# Patient Record
Sex: Female | Born: 1965 | Race: White | Hispanic: No | Marital: Married | Smoking: Current some day smoker
Health system: Southern US, Community
[De-identification: ages and names within clinical notes are randomized; demographics above are authoritative.]

## PROBLEM LIST (undated history)

## (undated) DIAGNOSIS — G8929 Other chronic pain: Secondary | ICD-10-CM

## (undated) DIAGNOSIS — M549 Dorsalgia, unspecified: Secondary | ICD-10-CM

## (undated) DIAGNOSIS — E119 Type 2 diabetes mellitus without complications: Secondary | ICD-10-CM

## (undated) DIAGNOSIS — M797 Fibromyalgia: Secondary | ICD-10-CM

---

## 2007-07-09 ENCOUNTER — Ambulatory Visit (HOSPITAL_COMMUNITY): Admission: RE | Admit: 2007-07-09 | Discharge: 2007-07-09 | Payer: Self-pay | Admitting: Neurosurgery

## 2007-07-16 ENCOUNTER — Emergency Department (HOSPITAL_COMMUNITY): Admission: EM | Admit: 2007-07-16 | Discharge: 2007-07-16 | Payer: Self-pay | Admitting: Emergency Medicine

## 2007-08-06 ENCOUNTER — Ambulatory Visit (HOSPITAL_COMMUNITY): Admission: RE | Admit: 2007-08-06 | Discharge: 2007-08-07 | Payer: Self-pay | Admitting: Neurosurgery

## 2008-02-25 ENCOUNTER — Ambulatory Visit (HOSPITAL_COMMUNITY): Admission: RE | Admit: 2008-02-25 | Discharge: 2008-02-25 | Payer: Self-pay | Admitting: Orthopedic Surgery

## 2009-04-03 IMAGING — CR DG CHEST 2V
2 series · 2 of 2 positions shown · non-contrast
Comparison: None.

CLINICAL DATA: PREADMIT  ;OR 07/09/07;

CHEST - 2 VIEW

[view not recorded (1 of 2)]
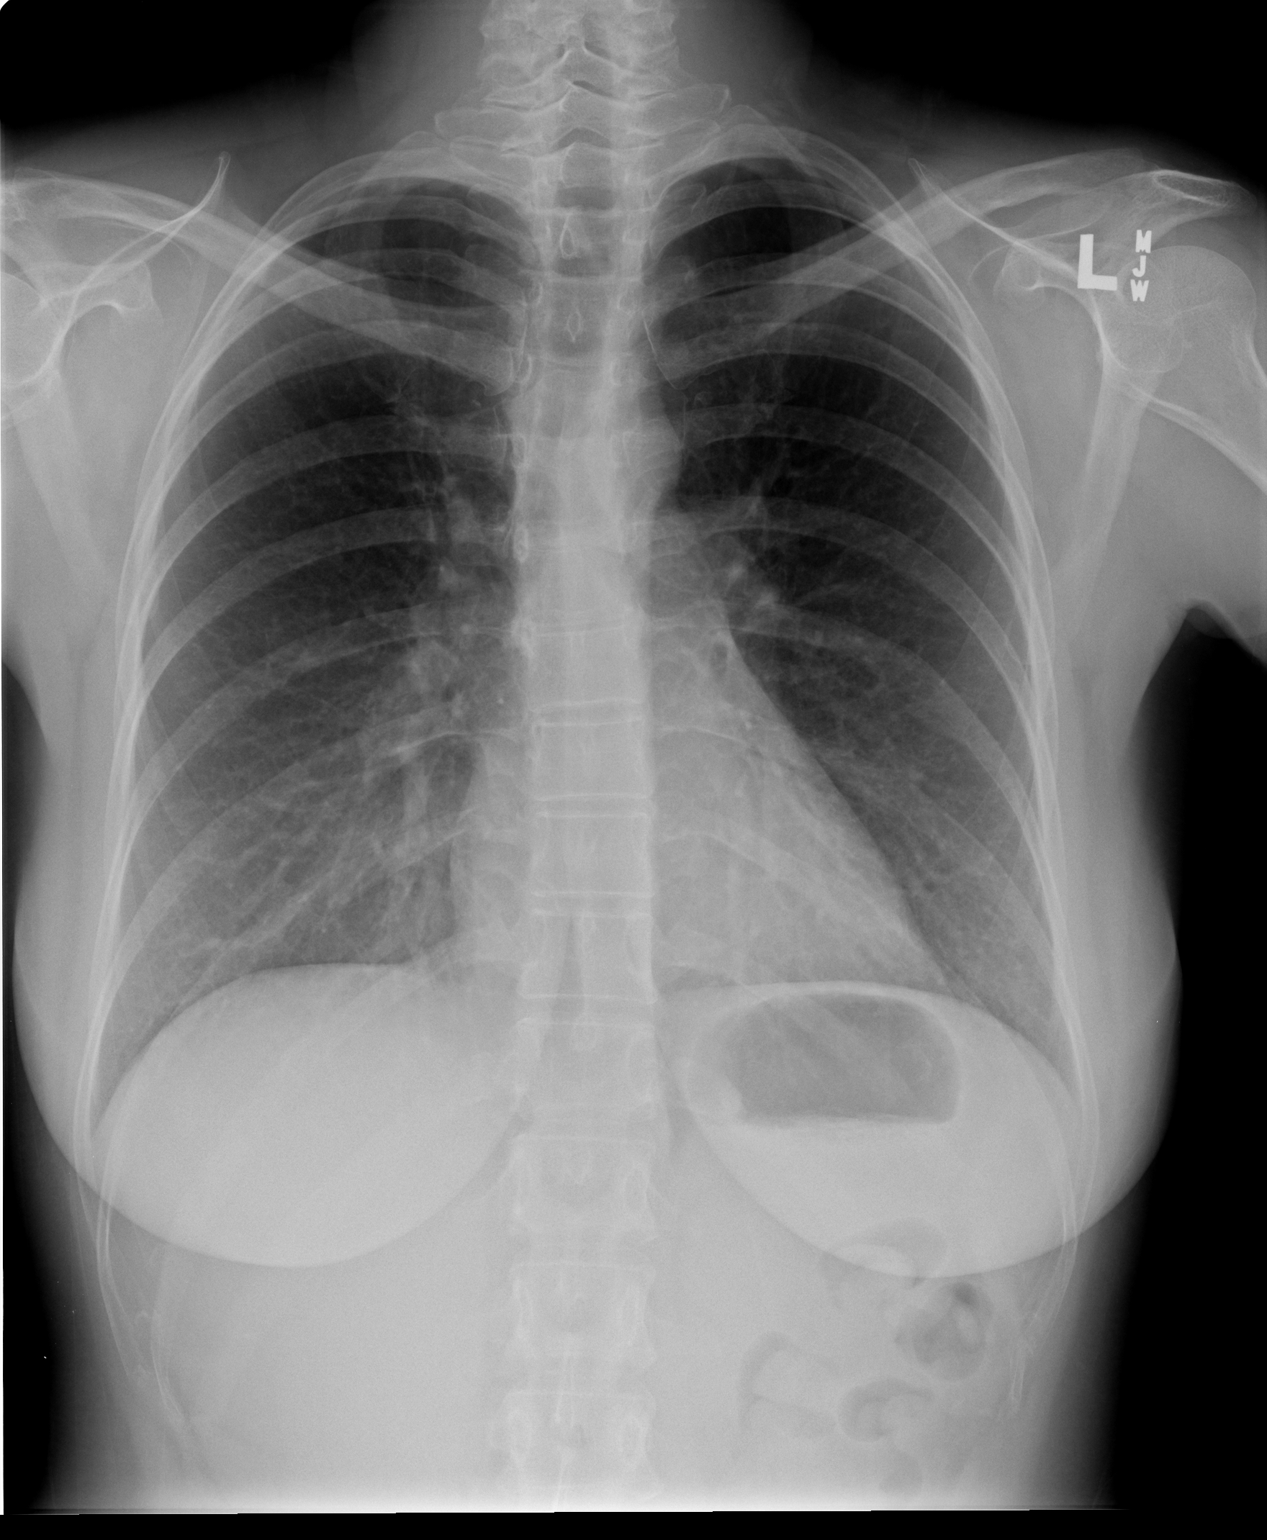

[view not recorded (2 of 2)]
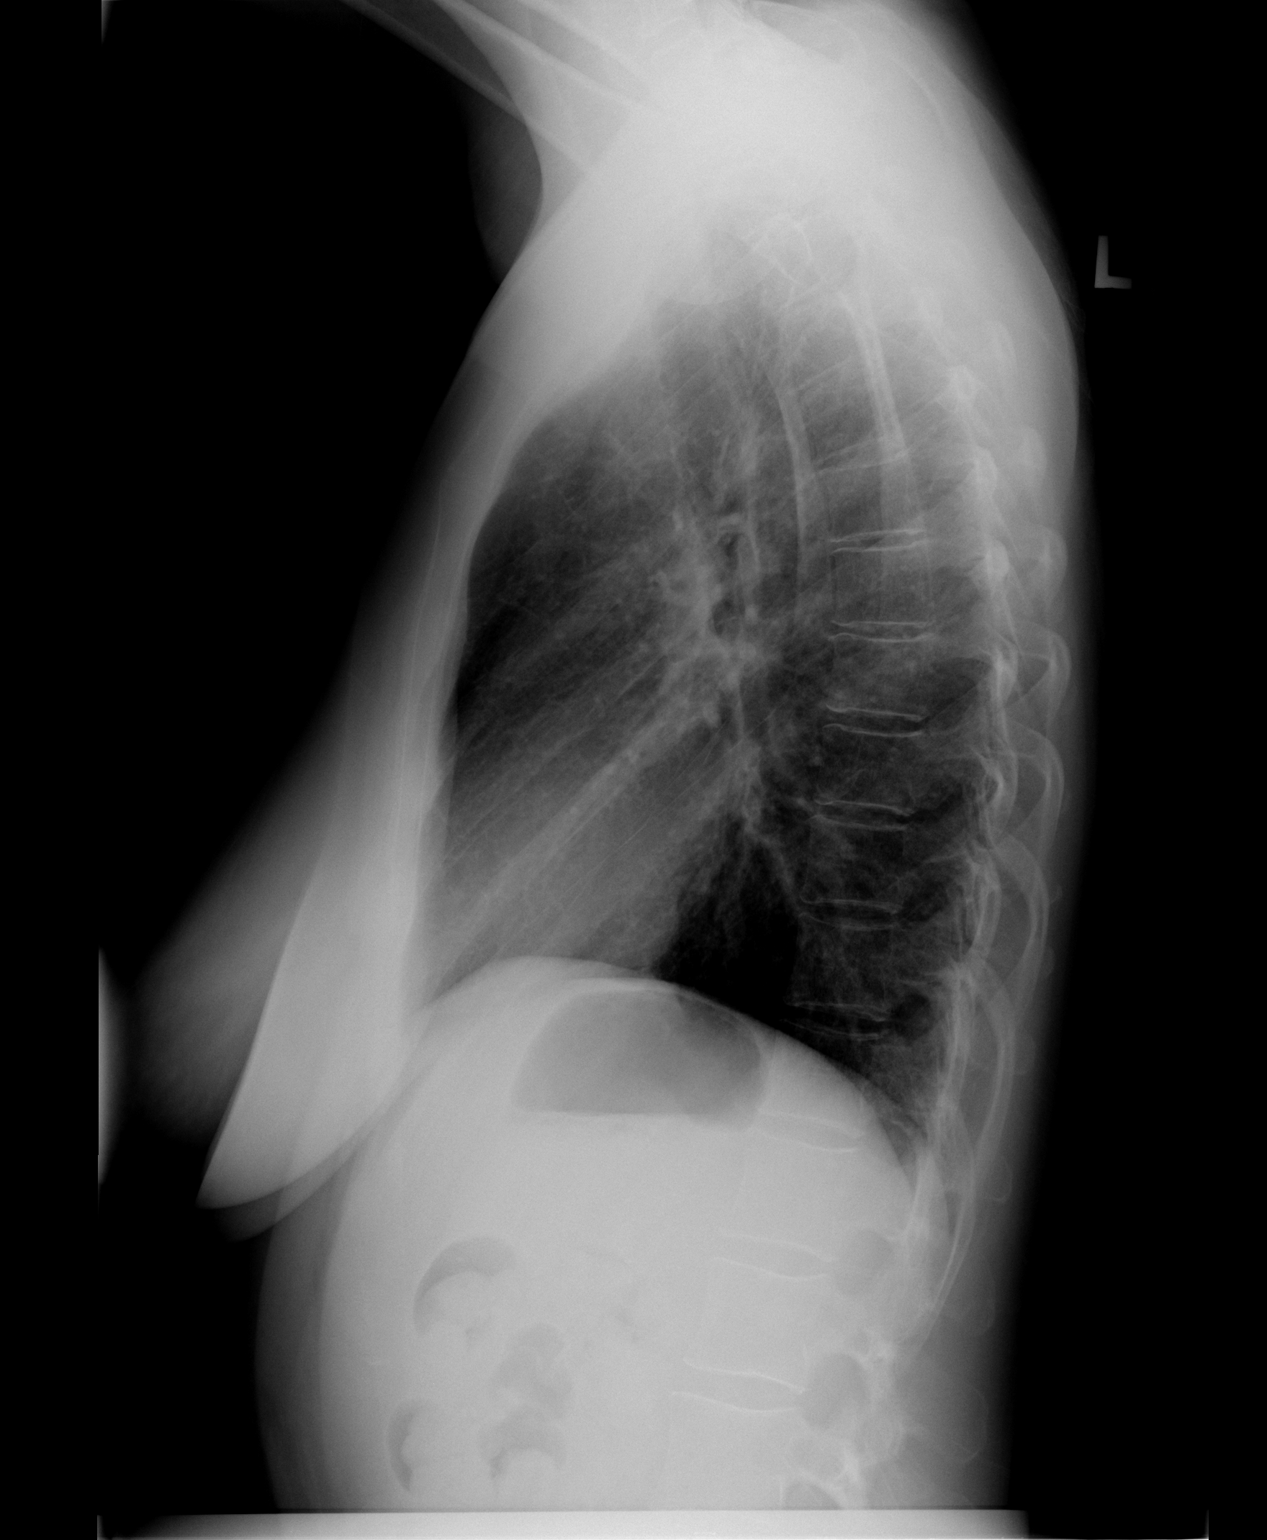

[2 of 2 positions shown; findings below may reference images not displayed]

FINDINGS: There is mild peribronchial thickening but no focal
airspace disease.  Heart size is normal.  No effusion.  No focal
bony abnormality.
IMPRESSION: Findings compatible with chronic bronchitic change.  No acute
finding.

## 2009-04-08 IMAGING — CR DG LUMBAR SPINE 2-3V
1 series · 1 of 1 positions shown · non-contrast
Comparison: none

CLINICAL DATA: L4-5 laminectomy, microdiskectomy, HNP.  
 LUMBAR SPINE ? 2 VIEW:
 View #1 was taken at 9347 hours and reveals needle pointer posteriorly projecting between the L4 and L5 spinous process and aimed at the superior aspect of L5.  
 View #2 was taken at 0033 hours revealing retractors in place and surgical instrument pointer aimed at the superior aspect of L5, basically at the L4-5 interspace.

[view not recorded]
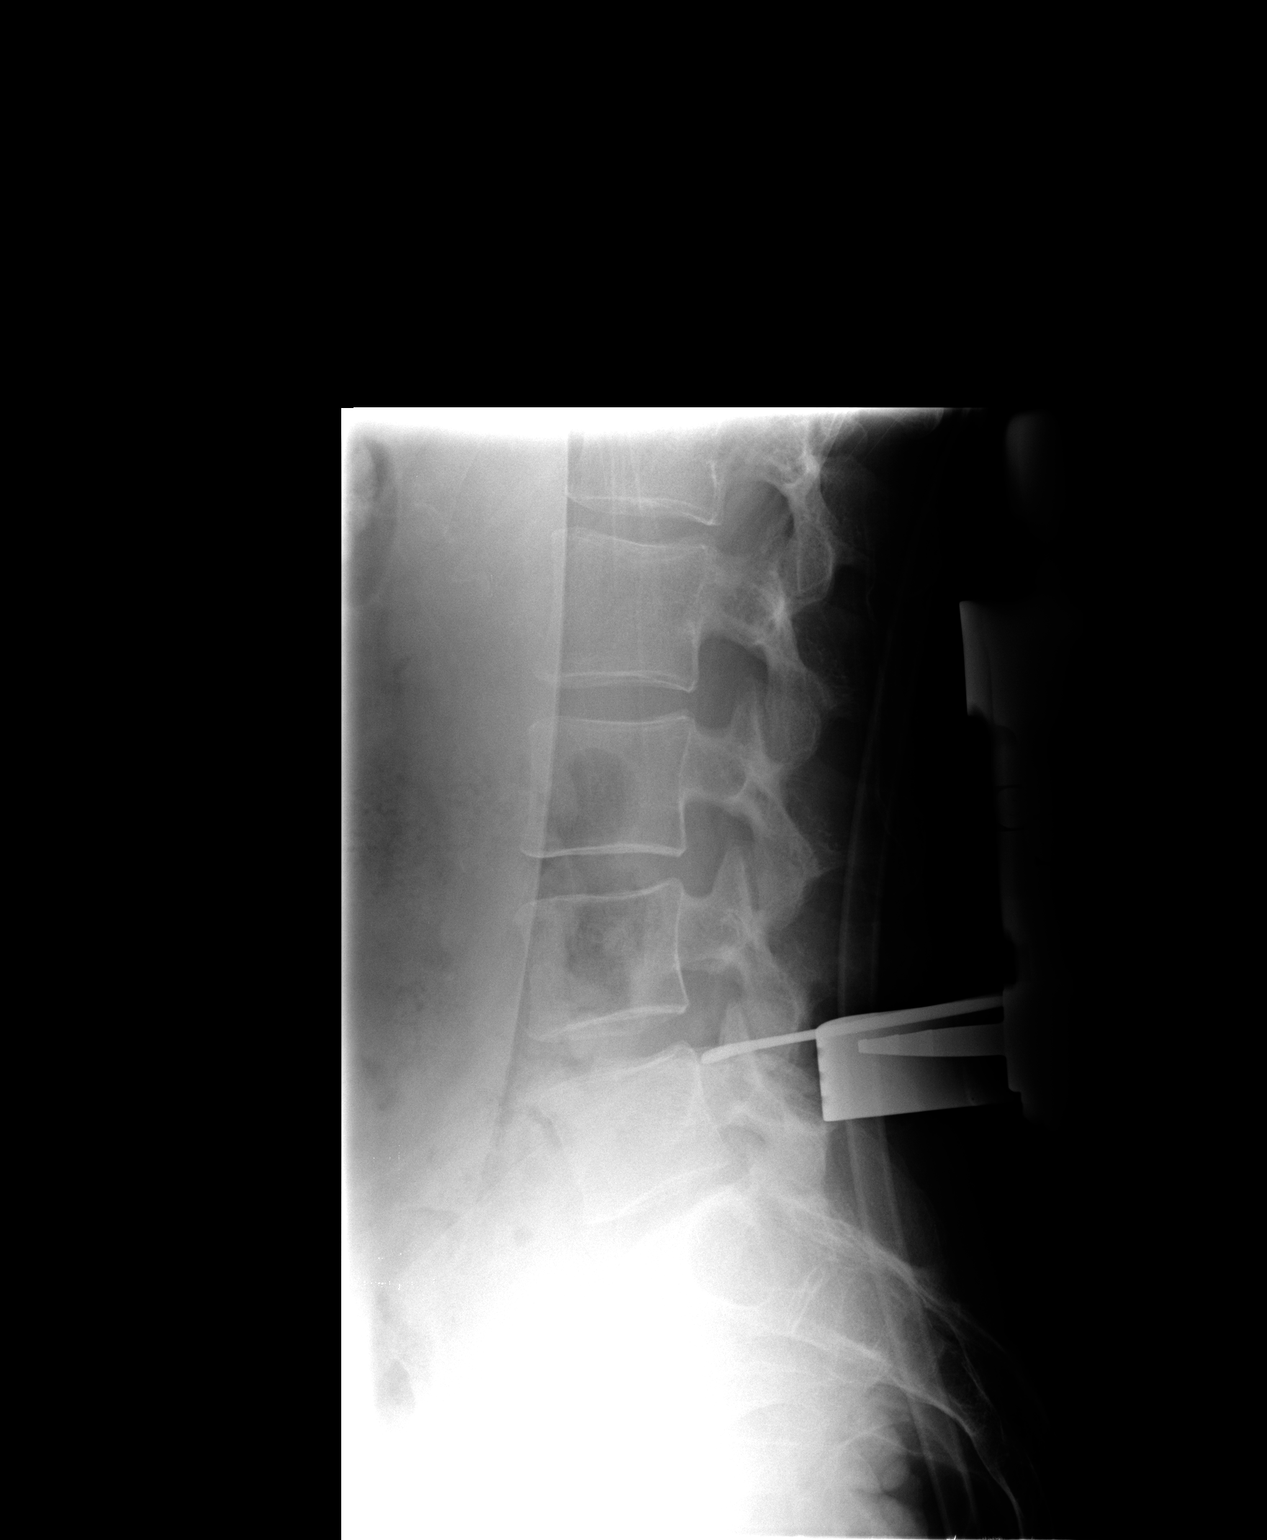

[1 of 1 positions shown; findings below may reference images not displayed]

IMPRESSION: Intraoperative localization at L4-5.

## 2009-04-15 IMAGING — CT CT L SPINE W/O CM
4 of 6 series · 17 of 36 positions shown, 19 images · non-contrast
Comparison: Lumbar localizer in the operating [HOSPITAL]/23/3113

CLINICAL DATA: Back surgery 1 week ago.  Weakness and pain.  Fall.

CT LUMBAR SPINE WITHOUT CONTRAST
TECHNIQUE: Multidetector CT imaging of the lumbar spine was
performed without intravenous contrast administration. Multiplanar
CT image reconstructions were also generated.

[Series 2: l-spine helical · axial · 0.32mm/px · z∈[-279,-112]mm · 5 of 101 slices shown, 7 images]
[im 17/101  soft-tissue]
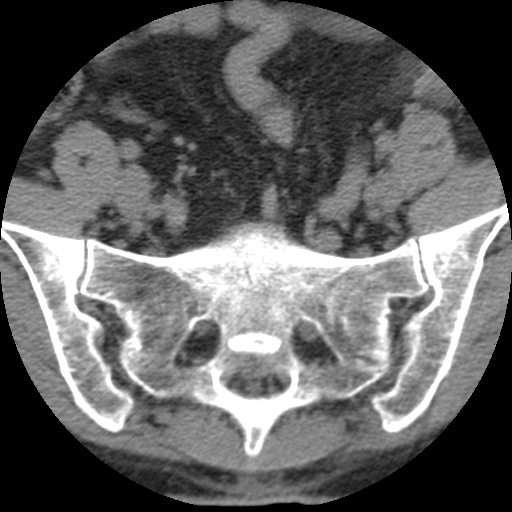
[im 17/101  bone]
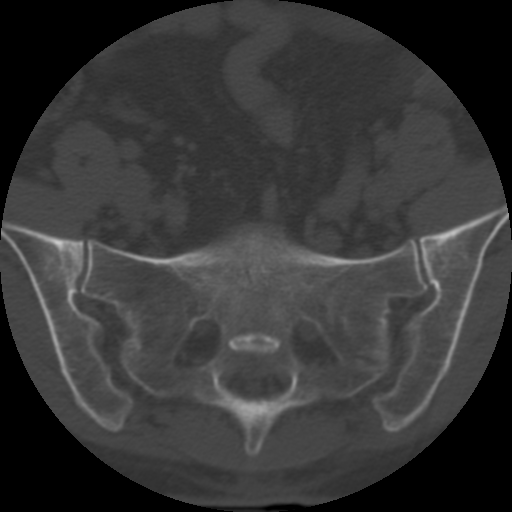
[im 34/101  bone]
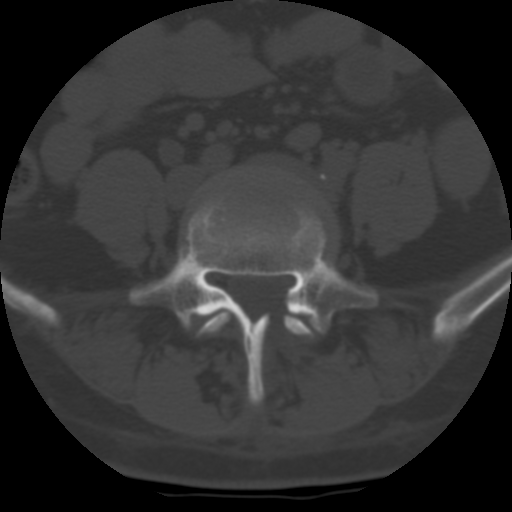
[im 51/101  bone]
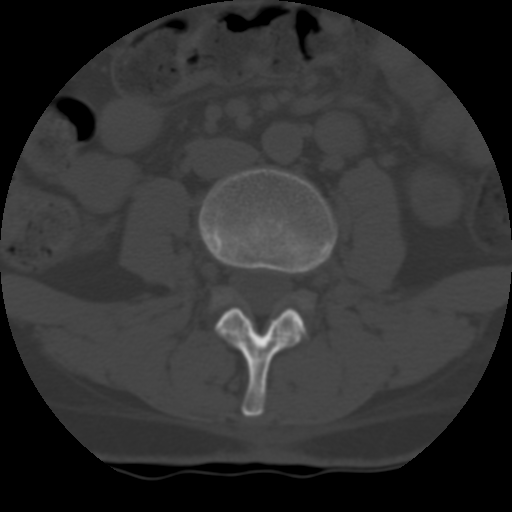
[im 67/101  bone]
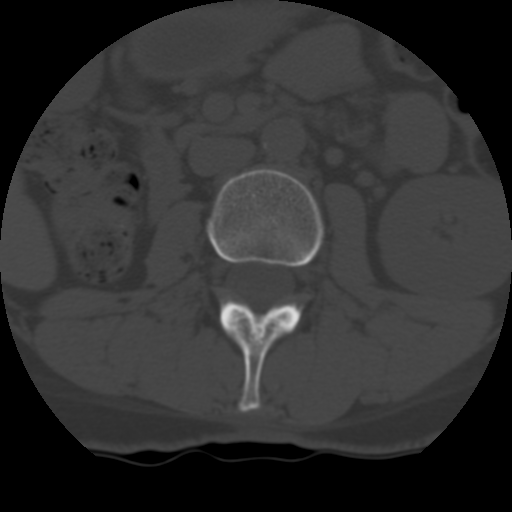
[im 84/101  soft-tissue]
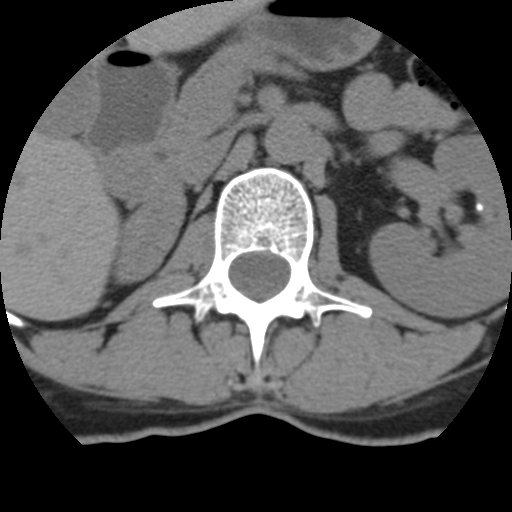
[im 84/101  bone]
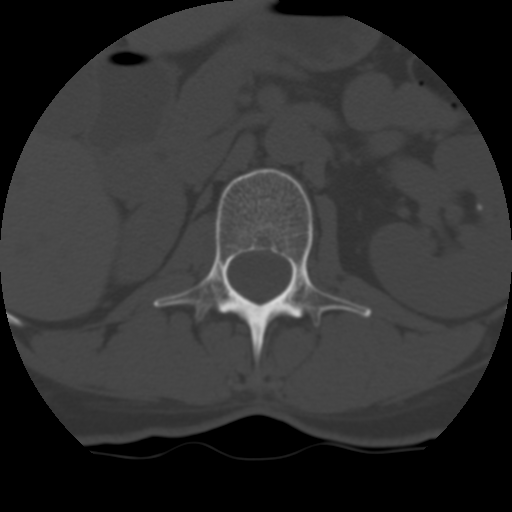

[Series 3: recon 2: l-spine helical · axial · 0.32mm/px · z∈[-279,-112]mm · 5 of 101 slices shown]
[im 17/101  bone]
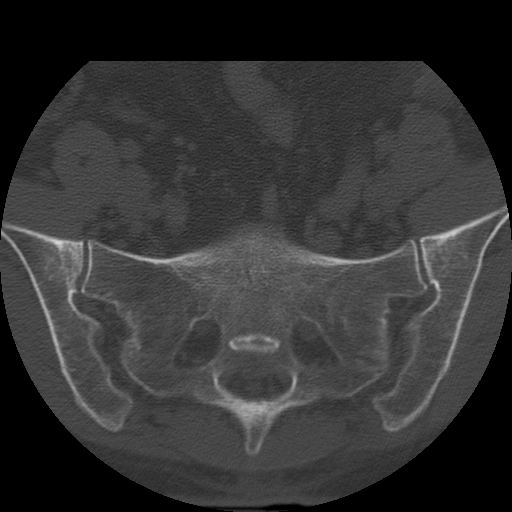
[im 34/101  bone]
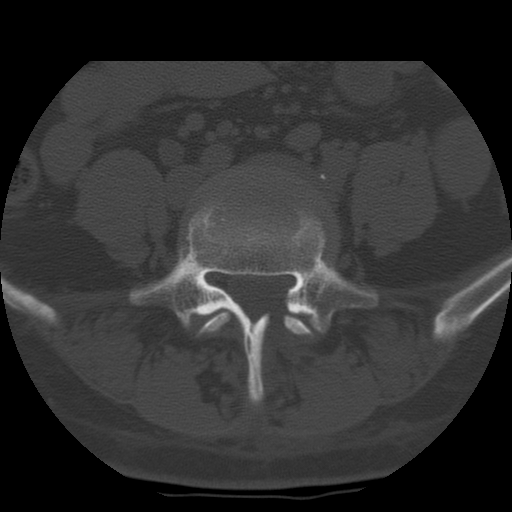
[im 51/101  bone]
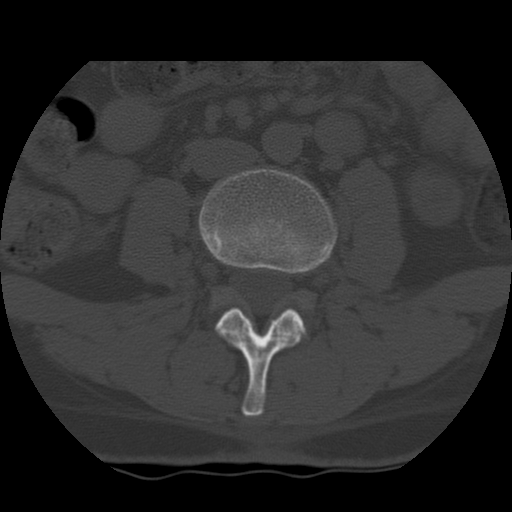
[im 67/101  bone]
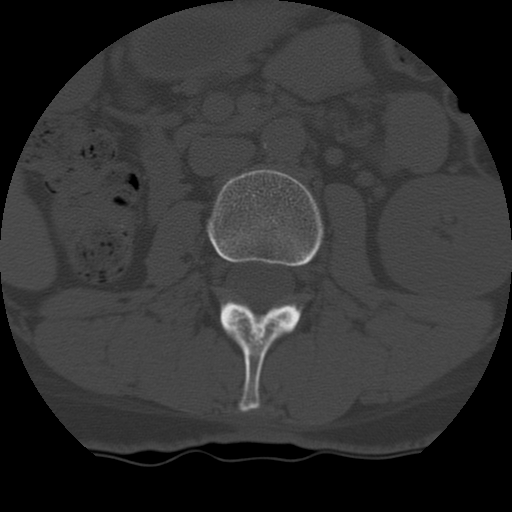
[im 84/101  bone]
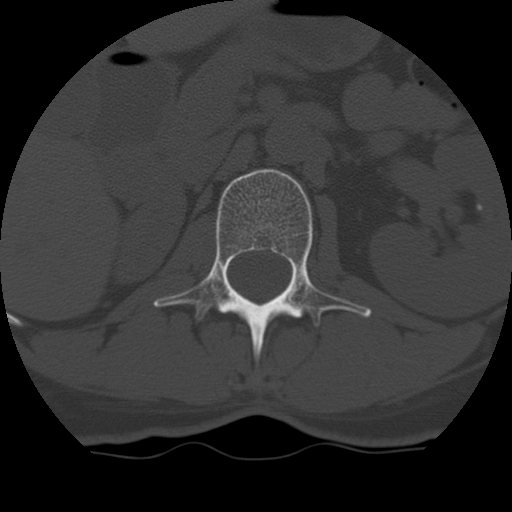

[Series 401: reformatted · sagittal · 0.50mm/px · 1 of 78 slices shown (1 of 2)]
[im 39/78  bone]
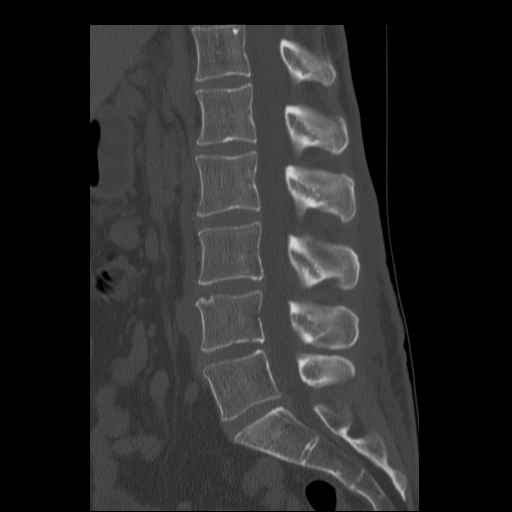

[Series 403: reformatted · coronal · 0.50mm/px · 6 of 73 slices shown (2 of 2)]
[im 2/73  soft-tissue]
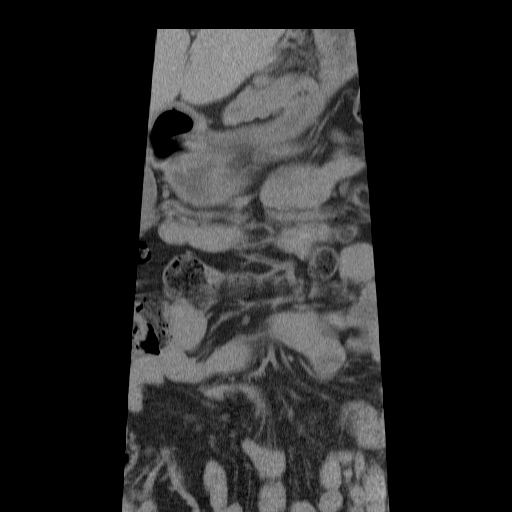
[im 13/73  bone]
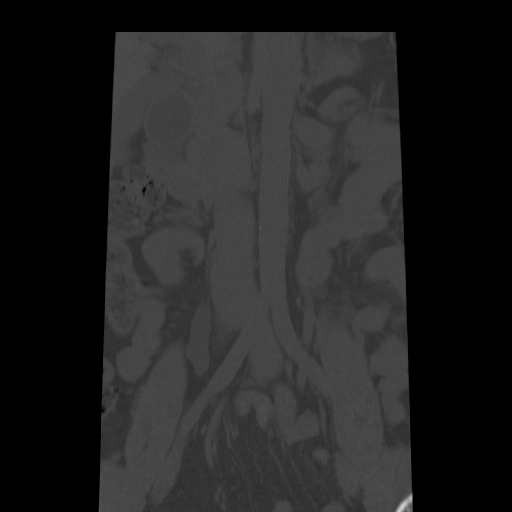
[im 25/73  bone]
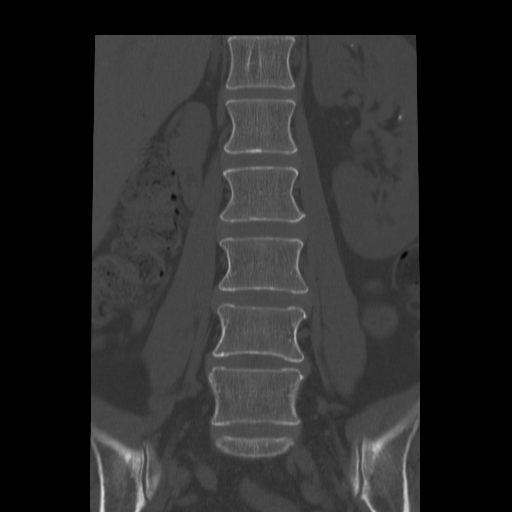
[im 37/73  bone]
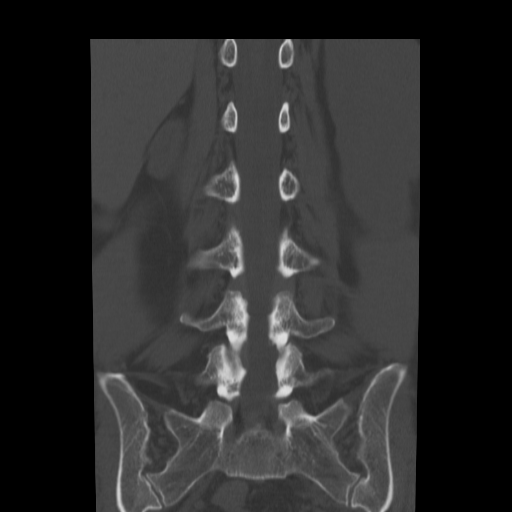
[im 49/73  bone]
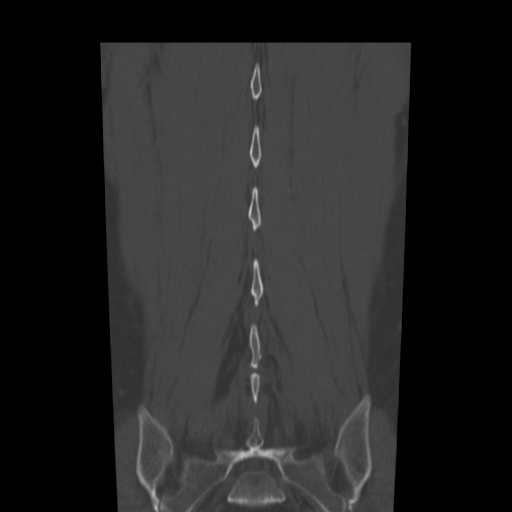
[im 61/73  bone]
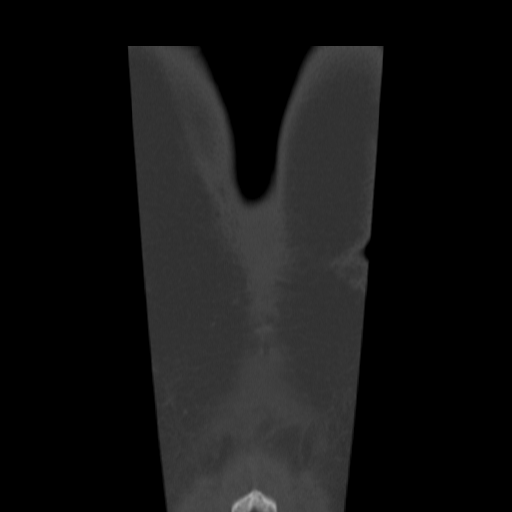

[17 of 36 positions shown; findings below may reference images not displayed]

FINDINGS: The lumbar alignment is normal.  There is no fracture.

L1-2:  Negative

L2-3:  Negative

L3-4:  There is mild disc bulging and mild lateral disc protrusion
on the left.  There is mild facet arthropathy.

L4-5:  There is diffuse bulging of the disc.  There is been recent
laminotomy on the left with some gas in the epidural space and
laminectomy bed.  No recurrent disc protrusion or spinal stenosis
is apparent.  There is no fluid collection.

L5-S1:  Mild disc bulging without spinal stenosis.

Note is made of congenital absence of the right kidney with an
enlarged hypertrophied left kidney.  There are small nonobstructing
stones in the left upper and mid kidney measuring two and 3 mm.  No
ureteral calculus is identified.
IMPRESSION: Postop laminectomy on the left at L4-5 without apparent
complication

Negative for fracture

Congenital absence of the right kidney.  Nonobstructing stones on
the left kidney.

## 2010-05-02 ENCOUNTER — Emergency Department (HOSPITAL_COMMUNITY)
Admission: EM | Admit: 2010-05-02 | Discharge: 2010-05-03 | Payer: Self-pay | Source: Home / Self Care | Admitting: Emergency Medicine

## 2010-05-03 LAB — CBC
HCT: 46.6 % — ABNORMAL HIGH (ref 36.0–46.0)
Hemoglobin: 15.8 g/dL — ABNORMAL HIGH (ref 12.0–15.0)
MCH: 32.6 pg (ref 26.0–34.0)
MCHC: 33.9 g/dL (ref 30.0–36.0)
MCV: 96.3 fL (ref 78.0–100.0)
Platelets: 178 10*3/uL (ref 150–400)
RBC: 4.84 MIL/uL (ref 3.87–5.11)
RDW: 13.1 % (ref 11.5–15.5)
WBC: 8.8 10*3/uL (ref 4.0–10.5)

## 2010-05-03 LAB — DIFFERENTIAL
Basophils Absolute: 0 10*3/uL (ref 0.0–0.1)
Basophils Relative: 0 % (ref 0–1)
Eosinophils Absolute: 0.1 10*3/uL (ref 0.0–0.7)
Eosinophils Relative: 1 % (ref 0–5)
Lymphocytes Relative: 39 % (ref 12–46)
Lymphs Abs: 3.4 10*3/uL (ref 0.7–4.0)
Monocytes Absolute: 0.3 10*3/uL (ref 0.1–1.0)
Monocytes Relative: 3 % (ref 3–12)
Neutro Abs: 5 10*3/uL (ref 1.7–7.7)
Neutrophils Relative %: 57 % (ref 43–77)

## 2010-05-03 LAB — BASIC METABOLIC PANEL
BUN: 10 mg/dL (ref 6–23)
CO2: 27 mEq/L (ref 19–32)
Calcium: 9.6 mg/dL (ref 8.4–10.5)
Chloride: 101 mEq/L (ref 96–112)
Creatinine, Ser: 0.79 mg/dL (ref 0.4–1.2)
GFR calc Af Amer: >60 mL/min (ref >60)
GFR calc non Af Amer: >60 mL/min (ref >60)
Glucose, Bld: 157 mg/dL — ABNORMAL HIGH (ref 70–99)
Potassium: 4.6 mEq/L (ref 3.5–5.1)
Sodium: 137 mEq/L (ref 135–145)

## 2010-05-03 LAB — POCT CARDIAC MARKERS
CKMB, poc: <1 ng/mL — ABNORMAL LOW (ref 1.0–8.0)
Myoglobin, poc: 42 ng/mL (ref 12–200)
Troponin i, poc: <0.05 ng/mL (ref 0.00–0.09)

## 2010-08-31 NOTE — Op Note (Signed)
Brooke Rhodes, Brooke Rhodes             ACCOUNT NO.:  [id number]   MEDICAL RECORD NO.:  [id number]          PATIENT TYPE:  AMB   LOCATION:  SDS                          FACILITY:  [facility]   PHYSICIAN:  Henry A. Pool, M.D.    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   PROCEDURE:  Left L4-5 laminotomy microdiskectomy.   SURGEON:  Kathaleen Maser. Pool, M.D.   ASSISTANT:  Donalee Citrin, M.D.   ANESTHESIA:  General orotracheal anesthesia.   INDICATIONS FOR PROCEDURE:  Brooke Rhodes is a 45 year old female history  of back and left lower extremity pain, paresthesias and weakness  consistent with a left-sided L5 radiculopathy.  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  We  discussed options of management of operative and nonoperative care.  The  patient desired to proceed with a left-sided L4-5 laminotomy  microdiskectomy in hopes of improving her symptoms.   DESCRIPTION OF PROCEDURE:  Patient taken to the operating room and  placed on the operating table in the supine position.  After adequate  level of anesthesia was achieved, the patient positioned prone onto  Wilson frame, appropriately padded the patient lumbar regions, prepped  and draped sterilely.  A #10 blade was used to make a linear skin  incision overlying the L4-5 interspace.  This was carried down sharply  in the midline.  A subperiosteal dissection was then performed exposing  the lamina of facet joints at L4 and L5 on the left side.  Deep self-  retaining retractor was placed.  Intraoperative x-rays taken and level  was confirmed.  Laminotomy was then performed using high-speed  drill and  Kerrison rongeurs to remove the inferior aspect of the lamina of L4,  medial aspect of the L4-5 facet joint and the superior rim of the L5  lamina.  Ligament flavum was then elevated and resected in piecemeal  fashion using Kerrison rongeurs __________  thecal sac and exiting L5  nerve were identified.  Microscope was then brought into the field using  microdissection of the left side L5 nerve root underlying disk  herniation.  Epidural venous plexus coagulated and cut.  Thecal sac and  L5 nerve root were gently mobilized and retracted towards the midline.  Disk space was identified as was the disk herniation.  The disc was then  incised with a 15 blade in rectangular fashion.  Wide disk space clean-  out was then achieved using pituitary rongeurs, upward angle pituitary  rongeurs and Epstein curettes.  All elements of the disk herniation were  completely resected.  All loose or obviously degenerative disk material  was then removed from the interspace.  At this point, a very thorough  diskectomy had been achieved.  There was no evidence of injury to thecal  sac or nerve roots.  We then inspected for hemostasis, found to be good.  Wound was then irrigated with antibiotic solution.  Gelfoam was placed  topically for hemostasis and found to be good.  Microscope and retractor  system removed.  Hemostasis of muscle achieved with  electrocautery.  Wound was then closed in layers with Vicryl suture.  Steri-Strips and sterile dressing were applied.  There were no apparent  complications.  The patient tolerated the procedure well and she returns  to the recovery room postoperatively.           ______________________________  Kathaleen Maser Pool, M.D.     HAP/MEDQ  D:  07/09/2007  T:  07/09/2007  Job:  [id number]

## 2010-08-31 NOTE — Op Note (Signed)
Brooke Rhodes, Brooke Rhodes             ACCOUNT NO.:  [id number]   MEDICAL RECORD NO.:  [id number]          PATIENT TYPE:  AMB   LOCATION:  SDS                          FACILITY:  [facility]   PHYSICIAN:  Almedia Balls. Ranell Patrick, M.D. DATE OF BIRTH:  [birth year]   DATE OF PROCEDURE:  DATE OF DISCHARGE:  02/25/2008                               OPERATIVE REPORT   PREOPERATIVE DIAGNOSIS:  Left shoulder frozen shoulder.   POSTOPERATIVE DIAGNOSIS:  Left shoulder frozen shoulder.   PROCEDURE PERFORMED:  1. Left shoulder examination under anesthesia.  2. Shoulder manipulation under anesthesia.  3. Shoulder arthroscopy with extensive intraarticular debridement      including arthroscopic capsule release and rotator interval      release.   ATTENDING SURGEON:  Almedia Balls. Ranell Patrick, M.D.   ASSISTANT:  Donnie Coffin. Dixon, PA-C.   ANESTHESIA:  General anesthesia plus interscalene block anesthesia was  used.   ESTIMATED BLOOD LOSS:  Minimal.   FLUID REPLACEMENT:  1200 mL crystalloid.   INSTRUMENT COUNT:  Correct.   COMPLICATIONS:  None.   Preoperative antibiotics were given.   INDICATIONS:  The patient is a 45 year old female with a history of  worsening left shoulder pain and loss of function and range of motion  secondary to frozen shoulder.  Extensive preoperative nonsurgical  management was tried including intraarticular injection therapy,  activity modification, and then pain medication.  The patient is unable  to take antiinflammatories secondary to severe diabetes and fear for  kidney injury.  Informed consent was obtained with the patient.  She  elected to proceed with surgical management.   DESCRIPTION OF PROCEDURE:  After an adequate level of anesthesia was  achieved, the patient was positioned supine on the operating table.  She  was brought up in the modified beach chair position.  The left shoulder  was examined under anesthesia.  The patient had forward elevation about  90, abduction 60  degrees, external rotation 30, internal rotation 30.  We performed a general manipulation under anesthesia, really not making  any gain.  She was fairly tight, and just not get a good pop with her  shoulder.  We went ahead and then sterilely prepped and draped the left  shoulder and arm in the usual manner.  We entered the shoulder using  standard portals including anterior, posterior, and lateral portals.  We  identified capsulitis and synovitis consistent with adhesive capsulitis.  We performed a rotator interval release, performed the anteroinferior  capsule release including anteroinferior band and middle glenohumeral  ligament.  We went ahead and identified a normal subscap, normal labrum,  normal rotator cuff, and normal articular cartilage.  Superior labrum  and biceps anchor was intact, posterior labrum intact.   We performed 360-degree capsule release with the ArthroCare 1, careful  __________ not to injure the axillary nerve.  We did the inferior  release with short burst with the ArthroCare with the one point away  from the nerve under direct visualization.  Following complete capsule  release, we went ahead and did a general manipulation and had full  passive range of motion of the  shoulder, placed the scope in subacromial  space, did a minimal bursectomy just enough to see the rotator cuff that  was intact, and there were no impinging lesions noted in the subacromial  space.  At this point, we concluded surgery suturing the wounds with 4-0  Monocryl followed by Steri-Strips, sterile dressing, and shoulder sling.  The patient tolerated the surgery well.      Almedia Balls. Ranell Patrick, M.D.  Electronically Signed     SRN/MEDQ  D:  02/25/2008  T:  02/26/2008  Job:  [id number]

## 2010-08-31 NOTE — Op Note (Signed)
Brooke Rhodes, Brooke Rhodes             ACCOUNT NO.:  [id number]   MEDICAL RECORD NO.:  [id number]          PATIENT TYPE:  OIB   LOCATION:  3025                         FACILITY:  [facility]   PHYSICIAN:  Kathaleen Maser. Pool, M.D.    DATE OF BIRTH:  Nov 16, 1965   DATE OF PROCEDURE:  08/06/2007  DATE OF DISCHARGE:                               OPERATIVE REPORT   PREOPERATIVE DIAGNOSIS:  Left L4-5 recurrent herniated nucleus pulposus  with radiculopathy.   POSTOPERATIVE DIAGNOSIS:  Left L4-5 recurrent herniated nucleus pulposus  with radiculopathy.   PROCEDURE NOTE:  Left L4-5 re-exploration of laminotomy with redo  microdiskectomy.   SURGEON:  Kathaleen Maser. Pool, M.D.   ASSISTANT:  __________   ANESTHESIA:  General endotracheal.   INDICATIONS:  [name] is a 44 year old female with history of a  previous left-sided L4-5 laminotomy and microdiskectomy.  Postoperatively, the patient initially did well and then developed  severe left lower extremity radicular pain and a left-sided L4 versus L5  radicular pattern.  She has failed conservative management.  Workup  demonstrates evidence of recurrent disk herniation of the left side at  L4-5 with compression of the thecal sac and L5 nerve root and some  question of whether there is also a superior fragment as well.  I  discussed the situation with the patient.  She wishes to proceed with  redo laminotomy and microdiskectomy to help __________ improvement of  her symptoms.   OPERATIVE NOTE:  The patient placed on the operative table in supine  position.  After __________  local anesthesia was achieved, the patient  was placed prone on Wilson frame firmly padded.  The patient's lumbar  regions were prepped and draped sterilely.  A 10-blade was used to make  a curvilinear skin incision overlying the L4-5 interspace.  This was  carried down sharply in midline.  Subperiosteal dissection was then  performed exposing the lamina and facet joints on the left  side.  Deep  self-retainer was placed.  Intraoperative x-ray was taken, and the level  was confirmed.  It should be noted that upon entering the subcutaneous  compartment, there was some fluid at the laminotomy on the left-sided L4-  5.  It was dissected free using __________ , and the laminotomy was  widened slightly using Kerrison rongeurs.  Underlying thecal sac and  exiting L5 nerve root were identified.  Microscope was then brought till  this microdissection of the left-sided L5 nerve root underlying disk  herniation.  Epidural venous plexus was coagulated and cut.  Thecal sac  and L5 nerve root were gently mobilized and tracked towards the midline.  Recurrent disk herniation was encountered and resected using pituitary  rongeurs.  Blunt nerve hooks were then able to dissect free of superior  fragment, which was completely removed.  At this point, a very thorough  recurrent diskectomy was achieved.  There was no injury to thecal sac or  nerve roots.  Wound was then irrigated with antibiotic solution.  Gelfoam was placed topically for hemostasis, which was found to be good.  The  microscope and Kerrisons were removed.  Hemostasis was achieved with  electrocautery.  Wounds were closed in layers with Vicryl suture.  Steri-  Strips and sterile dressing were applied.  There were no complications.  The patient tolerated the procedure well, and she returned to the  recovery room postoperatively.           ______________________________  Kathaleen Maser Pool, M.D.     HAP/MEDQ  D:  08/06/2007  T:  08/07/2007  Job:  [id number]

## 2011-01-10 LAB — DIFFERENTIAL
Basophils Absolute: 0
Basophils Absolute: 0.2 — ABNORMAL HIGH
Basophils Relative: 0
Basophils Relative: 1
Eosinophils Absolute: 0
Eosinophils Absolute: 0.5
Eosinophils Relative: 0
Eosinophils Relative: 3
Lymphocytes Relative: 14
Lymphocytes Relative: 17
Lymphs Abs: 1.3
Lymphs Abs: 2.9
Monocytes Absolute: 0.1
Monocytes Absolute: 0.3
Monocytes Relative: 1 — ABNORMAL LOW
Monocytes Relative: 2 — ABNORMAL LOW
Neutro Abs: 13.2 — ABNORMAL HIGH
Neutro Abs: 8 — ABNORMAL HIGH
Neutrophils Relative %: 78 — ABNORMAL HIGH
Neutrophils Relative %: 85 — ABNORMAL HIGH

## 2011-01-10 LAB — CBC
HCT: 42.5
HCT: 45.2
Hemoglobin: 14.5
Hemoglobin: 15.7 — ABNORMAL HIGH
MCHC: 34.2
MCHC: 34.7
MCV: 100.4 — ABNORMAL HIGH
MCV: 98.8
Platelets: 158
Platelets: 188
RBC: 4.24
RBC: 4.57
RDW: 12.8
RDW: 13.2
WBC: 17 — ABNORMAL HIGH
WBC: 9.4

## 2011-01-10 LAB — POCT I-STAT 4, (NA,K, GLUC, HGB,HCT)
Glucose, Bld: 138 — ABNORMAL HIGH
HCT: 41
Hemoglobin: 13.9
Operator id: 219291
Potassium: 3.9
Sodium: 142

## 2011-01-10 LAB — POCT I-STAT, CHEM 8
BUN: 25 — ABNORMAL HIGH
Calcium, Ion: 1.14
Chloride: 100
Creatinine, Ser: 1.1
Glucose, Bld: 297 — ABNORMAL HIGH
HCT: 49 — ABNORMAL HIGH
Hemoglobin: 16.7 — ABNORMAL HIGH
Potassium: 4.5
Sodium: 132 — ABNORMAL LOW
TCO2: 26

## 2011-01-10 LAB — URINALYSIS, ROUTINE W REFLEX MICROSCOPIC
Bilirubin Urine: NEGATIVE
Glucose, UA: 500 — AB
Hgb urine dipstick: NEGATIVE
Ketones, ur: NEGATIVE
Nitrite: NEGATIVE
Protein, ur: NEGATIVE
Specific Gravity, Urine: 1.014
Urobilinogen, UA: 0.2
pH: 5.5

## 2011-01-10 LAB — ABO/RH: ABO/RH(D): O POS

## 2011-01-10 LAB — TYPE AND SCREEN
ABO/RH(D): O POS
Antibody Screen: NEGATIVE

## 2011-01-11 LAB — BASIC METABOLIC PANEL
BUN: 10
CO2: 27
Calcium: 9
Chloride: 100
Creatinine, Ser: 0.82
GFR calc Af Amer: >60
GFR calc non Af Amer: >60
Glucose, Bld: 226 — ABNORMAL HIGH
Potassium: 4.7
Sodium: 136

## 2011-01-11 LAB — CBC
HCT: 41.3
Hemoglobin: 14.1
MCHC: 34.2
MCV: 100
Platelets: 223
RBC: 4.12
RDW: 12.9
WBC: 10.8 — ABNORMAL HIGH

## 2011-01-11 LAB — WOUND CULTURE: Culture: NO GROWTH

## 2011-01-18 LAB — BASIC METABOLIC PANEL
BUN: 15
CO2: 27
Calcium: 9.4
Chloride: 107
Creatinine, Ser: 0.67
GFR calc Af Amer: >60
GFR calc non Af Amer: >60
Glucose, Bld: 198 — ABNORMAL HIGH
Potassium: 4.2
Sodium: 141

## 2011-01-18 LAB — CBC
HCT: 46.3 — ABNORMAL HIGH
Hemoglobin: 15.3 — ABNORMAL HIGH
MCHC: 33.1
MCV: 100.5 — ABNORMAL HIGH
Platelets: 164
RBC: 4.6
RDW: 14.5
WBC: 9.4

## 2011-01-18 LAB — URINALYSIS, ROUTINE W REFLEX MICROSCOPIC
Bilirubin Urine: NEGATIVE
Glucose, UA: NEGATIVE
Hgb urine dipstick: NEGATIVE
Specific Gravity, Urine: 1.018
pH: 6

## 2011-01-18 LAB — DIFFERENTIAL
Basophils Absolute: 0.1
Basophils Relative: 1
Eosinophils Absolute: 0.2
Eosinophils Relative: 2
Lymphocytes Relative: 45
Lymphs Abs: 4.2 — ABNORMAL HIGH
Monocytes Absolute: 0.3
Monocytes Relative: 3
Neutro Abs: 4.6
Neutrophils Relative %: 50

## 2011-01-18 LAB — GLUCOSE, CAPILLARY
Glucose-Capillary: 186 — ABNORMAL HIGH
Glucose-Capillary: 224 — ABNORMAL HIGH
Glucose-Capillary: 54 — ABNORMAL LOW
Glucose-Capillary: 74
Glucose-Capillary: 96

## 2011-01-18 LAB — PROTIME-INR: INR: 0.8

## 2018-05-26 ENCOUNTER — Encounter (HOSPITAL_COMMUNITY): Payer: Self-pay

## 2018-05-26 ENCOUNTER — Other Ambulatory Visit: Payer: Self-pay

## 2018-05-26 ENCOUNTER — Emergency Department (HOSPITAL_COMMUNITY)
Admission: EM | Admit: 2018-05-26 | Discharge: 2018-05-27 | Disposition: A | Discharge status: Home / Self Care | Payer: 59 | Attending: Emergency Medicine | Admitting: Emergency Medicine

## 2018-05-26 DIAGNOSIS — F17210 Nicotine dependence, cigarettes, uncomplicated: Secondary | ICD-10-CM | POA: Diagnosis not present

## 2018-05-26 DIAGNOSIS — R451 Restlessness and agitation: Secondary | ICD-10-CM | POA: Insufficient documentation

## 2018-05-26 DIAGNOSIS — E11649 Type 2 diabetes mellitus with hypoglycemia without coma: Secondary | ICD-10-CM | POA: Insufficient documentation

## 2018-05-26 DIAGNOSIS — F10929 Alcohol use, unspecified with intoxication, unspecified: Secondary | ICD-10-CM | POA: Diagnosis present

## 2018-05-26 DIAGNOSIS — E162 Hypoglycemia, unspecified: Secondary | ICD-10-CM

## 2018-05-26 HISTORY — DX: Other chronic pain: G89.29

## 2018-05-26 HISTORY — DX: Dorsalgia, unspecified: M54.9

## 2018-05-26 HISTORY — DX: Fibromyalgia: M79.7

## 2018-05-26 HISTORY — DX: Type 2 diabetes mellitus without complications: E11.9

## 2018-05-26 LAB — CBG MONITORING, ED: GLUCOSE-CAPILLARY: 56 mg/dL — AB (ref 70–99)

## 2018-05-26 MED ORDER — DEXTROSE 50 % IV SOLN
INTRAVENOUS | Status: AC
  Administered 2018-05-27: 50 mL via INTRAVENOUS
  Filled 2018-05-26: qty 50

## 2018-05-26 NOTE — ED Triage Notes (Signed)
Pt arrived via [facility], pt from [employer] concert and apparently fell several times. Pt consumed alcohol but did not eat per EMS; Pt was alert but combative; CBG 88, 102/88, pt rec'd approx 350 NS

## 2018-05-26 NOTE — ED Notes (Signed)
CBG 56, RN Tray informed

## 2018-05-27 LAB — COMPREHENSIVE METABOLIC PANEL
ALBUMIN: 3.6 g/dL (ref 3.5–5.0)
ALT: 16 U/L (ref 0–44)
AST: 20 U/L (ref 15–41)
Alkaline Phosphatase: 67 U/L (ref 38–126)
Anion gap: 13 (ref 5–15)
BUN: 11 mg/dL (ref 6–20)
CHLORIDE: 104 mmol/L (ref 98–111)
CO2: 20 mmol/L — AB (ref 22–32)
CREATININE: 0.88 mg/dL (ref 0.44–1.00)
Calcium: 8.3 mg/dL — ABNORMAL LOW (ref 8.9–10.3)
GFR calc Af Amer: >60 mL/min (ref >60)
GFR calc non Af Amer: >60 mL/min (ref >60)
Glucose, Bld: 58 mg/dL — ABNORMAL LOW (ref 70–99)
Potassium: 3.2 mmol/L — ABNORMAL LOW (ref 3.5–5.1)
SODIUM: 137 mmol/L (ref 135–145)
Total Bilirubin: 0.4 mg/dL (ref 0.3–1.2)
Total Protein: 6.1 g/dL — ABNORMAL LOW (ref 6.5–8.1)

## 2018-05-27 LAB — CBC
HEMATOCRIT: 42.2 % (ref 36.0–46.0)
HEMOGLOBIN: 13.6 g/dL (ref 12.0–15.0)
MCH: 34.3 pg — ABNORMAL HIGH (ref 26.0–34.0)
MCHC: 32.2 g/dL (ref 30.0–36.0)
MCV: 106.6 fL — ABNORMAL HIGH (ref 80.0–100.0)
PLATELETS: 203 10*3/uL (ref 150–400)
RBC: 3.96 MIL/uL (ref 3.87–5.11)
RDW: 13.5 % (ref 11.5–15.5)
WBC: 10.1 10*3/uL (ref 4.0–10.5)
nRBC: 0 % (ref 0.0–0.2)

## 2018-05-27 LAB — CBG MONITORING, ED
GLUCOSE-CAPILLARY: 188 mg/dL — AB (ref 70–99)
GLUCOSE-CAPILLARY: 180 mg/dL — AB (ref 70–99)
Glucose-Capillary: 191 mg/dL — ABNORMAL HIGH (ref 70–99)
Glucose-Capillary: 135 mg/dL — ABNORMAL HIGH (ref 70–99)
Glucose-Capillary: 73 mg/dL (ref 70–99)

## 2018-05-27 MED ORDER — HALOPERIDOL LACTATE 5 MG/ML IJ SOLN
INTRAMUSCULAR | Status: AC
  Filled 2018-05-27: qty 1

## 2018-05-27 MED ORDER — HALOPERIDOL LACTATE 5 MG/ML IJ SOLN: 5.0000 mg | Freq: Once | INTRAMUSCULAR | Status: DC

## 2018-05-27 MED ORDER — DEXTROSE 50 % IV SOLN
50.0000 mL | Freq: Once | INTRAVENOUS | Status: AC
  Administered 2018-05-27: 50 mL via INTRAVENOUS

## 2018-05-27 MED ORDER — LORAZEPAM 2 MG/ML IJ SOLN
INTRAMUSCULAR | Status: AC
  Administered 2018-05-27: 2 mg
  Filled 2018-05-27: qty 1
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
## 2018-05-27 NOTE — ED Notes (Signed)
Sister Shanella Alatorre # [phone]

## 2018-05-27 NOTE — ED Notes (Signed)
Patient ambulated to restroom with 1 person assist.  Patient had no other issues while ambulating

## 2018-05-27 NOTE — ED Notes (Signed)
Pt discharged from ED; instructions provided; Pt encouraged to return to ED if symptoms worsen and to f/u with PCP; Pt verbalized understanding of all instructions 
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
## 2018-05-27 NOTE — ED Notes (Signed)
CBG 73, RN Tray informed

## 2018-05-27 NOTE — ED Notes (Signed)
CBG 135, RN Tray informed

## 2018-05-27 NOTE — ED Notes (Signed)
Pt removed c-collar; stated that she felt like she was choking; pt is A/Ox4; states that she is an [employer] fund[employer] over an assisted living community; pt educated on importance of leaving collar in place until evaluation is completed; however she refused collar.
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
